# Patient Record
Sex: Male | Born: 1985 | Race: White | Hispanic: No | Marital: Single | Smoking: Former smoker
Health system: Southern US, Community
[De-identification: ages and names within clinical notes are randomized; demographics above are authoritative.]

## PROBLEM LIST (undated history)

## (undated) DIAGNOSIS — I82409 Acute embolism and thrombosis of unspecified deep veins of unspecified lower extremity: Secondary | ICD-10-CM

## (undated) DIAGNOSIS — F1010 Alcohol abuse, uncomplicated: Secondary | ICD-10-CM

## (undated) DIAGNOSIS — F419 Anxiety disorder, unspecified: Secondary | ICD-10-CM

## (undated) HISTORY — PX: MOUTH SURGERY: SHX715

## (undated) HISTORY — DX: Acute embolism and thrombosis of unspecified deep veins of unspecified lower extremity: I82.409

## (undated) HISTORY — PX: OTHER SURGICAL HISTORY: SHX169

---
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
## 2012-10-06 HISTORY — PX: FEMUR FRACTURE SURGERY: SHX633

## 2012-10-22 ENCOUNTER — Encounter: Payer: Self-pay | Admitting: Cardiology

## 2012-10-22 ENCOUNTER — Ambulatory Visit
Admission: RE | Admit: 2012-10-22 | Discharge: 2012-10-22 | Disposition: A | Discharge status: Home / Self Care | Payer: BC Managed Care – PPO | Source: Ambulatory Visit | Attending: Orthopedic Surgery | Admitting: Orthopedic Surgery

## 2012-10-22 ENCOUNTER — Other Ambulatory Visit: Payer: Self-pay | Admitting: Orthopedic Surgery

## 2012-10-22 ENCOUNTER — Ambulatory Visit (INDEPENDENT_AMBULATORY_CARE_PROVIDER_SITE_OTHER): Payer: BC Managed Care – PPO | Admitting: Cardiology

## 2012-10-22 VITALS — BP 142/84 | HR 104 | Ht 75.0 in | Wt 184.5 lb

## 2012-10-22 DIAGNOSIS — I82401 Acute embolism and thrombosis of unspecified deep veins of right lower extremity: Secondary | ICD-10-CM

## 2012-10-22 DIAGNOSIS — R0602 Shortness of breath: Secondary | ICD-10-CM

## 2012-10-22 DIAGNOSIS — I82409 Acute embolism and thrombosis of unspecified deep veins of unspecified lower extremity: Secondary | ICD-10-CM

## 2012-10-22 DIAGNOSIS — Z8781 Personal history of (healed) traumatic fracture: Secondary | ICD-10-CM

## 2012-10-22 DIAGNOSIS — R0609 Other forms of dyspnea: Secondary | ICD-10-CM

## 2012-10-22 DIAGNOSIS — R609 Edema, unspecified: Secondary | ICD-10-CM

## 2012-10-22 DIAGNOSIS — R0600 Dyspnea, unspecified: Secondary | ICD-10-CM

## 2012-10-22 DIAGNOSIS — R52 Pain, unspecified: Secondary | ICD-10-CM

## 2012-10-22 HISTORY — DX: Acute embolism and thrombosis of unspecified deep veins of unspecified lower extremity: I82.409

## 2012-10-22 MED ORDER — RIVAROXABAN 20 MG PO TABS: 20.0000 mg | ORAL_TABLET | Freq: Every day | ORAL | Status: DC

## 2012-10-22 MED ORDER — RIVAROXABAN 15 MG PO TABS: 15.0000 mg | ORAL_TABLET | Freq: Two times a day (BID) | ORAL | Status: DC

## 2012-10-22 NOTE — Patient Instructions (Addendum)
Take Xarelto with food 15 mg twice a day for 21 days then 20 mg daily which we will stop in 3 months once repeat doppler is clear.  Have lab work done tomorrow.  We have ordered an echo (ultrasound) of you heart, to make sure it is stable- tomorrow.  Call if any questions.  We will repeat doppler of leg in 3 months.  No strenuous activity, walking around the house only for next several days.   Follow up with Nada Boozer, NP in 4 weeks.

## 2012-10-22 NOTE — Assessment & Plan Note (Addendum)
DVT of rt lower ext with pain, + dopplers , add Xarelto 15 mg BID with food for 21 days then 20 mg daily with food.  Repeat venous doppler in 3 months.  Will also check Echo to ensure no R v strain.  Follow up with me in 1 month. Dr. [name] in 3 months post follow up dopplers.

## 2012-10-22 NOTE — Progress Notes (Signed)
HPI: 27 year old white male presents today for request of Dr. Eulah Berry after venous Doppler study of right leg is positive for DVT.  2 weeks ago patient was in [place] fell off of a slide onto his right hip and fractured his femur, rods were placed.  The patient had been doing well until yesterday he complained of right leg pain he was sent for DVT Dopplers which were positive.     He is here for treatment.  No chest pain no true shortness of breath other than a mild twinge of discomfort or mild shortness of breath when he is anxious.  No history of diabetes, hypertension or any medical problems. No family history of coagulation problems.   No Known Allergies  Current Outpatient Prescriptions  Medication Sig Dispense Refill  . ALPRAZolam (XANAX) 0.25 MG tablet Take 0.25 mg by mouth at bedtime as needed.      Brent Berry HYDROcodone-acetaminophen (NORCO) 10-325 MG per tablet Take 1 tablet by mouth every 6 (six) hours as needed for pain.      . Rivaroxaban (XARELTO) 15 MG TABS tablet Take 1 tablet (15 mg total) by mouth 2 (two) times daily with a meal. For 21 day total, then 20 mg daily.  42 tablet  0  . Rivaroxaban (XARELTO) 15 MG TABS tablet Take 1 tablet (15 mg total) by mouth 2 (two) times daily with a meal.  21 tablet  0  . Rivaroxaban (XARELTO) 20 MG TABS tablet Take 1 tablet (20 mg total) by mouth daily with supper. Begin when 15 mg twice daily treatment (21 days total) is complete.  30 tablet  5   No current facility-administered medications for this visit.    Past Medical History  Diagnosis Date  . DVT of lower extremity (deep venous thrombosis) 10/22/12    Rt. lower leg, mid to distal SFV, extending down through the popliteal and posterior tibial and peroneal veins    Past Surgical History  Procedure Laterality Date  . Femur fracture surgery  10/06/12    rods placed right leg- Dr. Eulah Berry  . Foot warts      Family History  Problem Relation Age of Onset  . Diabetes type I Sister   .  Heart failure Maternal Grandmother   . Hypertension Maternal Grandmother   . Healthy Mother   . Healthy Father   . Healthy Brother     History   Social History  . Marital Status: Single    Spouse Name: [name]    Number of Children: [name]  . Years of Education: [name]   Occupational History  . Not on file.   Social History Main Topics  . Smoking status: Former Games developer  . Smokeless tobacco: Never Used     Comment: quit 8-9 years ago  . Alcohol Use: 3 - 3.5 oz/week    6-7 drink(s) per week  . Drug Use: No  . Sexual Activity: Not on file   Other Topics Concern  . Not on file   Social History Narrative  . No narrative on file    QMV:HQIONGE:XB colds or fevers, no weight changes Skin:no rashes or ulcers HEENT:no blurred vision, no congestion CV:see HPI PUL:see HPI GI:no diarrhea constipation or melena, no indigestion GU:no hematuria, no dysuria MS:+ rt hip pain at surgical site. + rt calf pain and swelling  Neuro:no syncope, no lightheadedness Endo:no diabetes, no thyroid disease   PHYSICAL EXAM BP 142/84  Pulse 104  Ht 6\' 3"  (1.905 m)  Wt 184 lb 8 oz (83.689 kg)  BMI 23.06 kg/m2 General:Pleasant affect, NAD, though anxious Skin:Warm and dry, brisk capillary refill HEENT:normocephalic, sclera clear, mucus membranes moist Neck:supple, no JVD, no bruits  Heart:S1S2 RRR without murmur, gallup, rub or click Lungs:clear without rales, rhonchi, or wheezes RUE:AVWU, non tender, + BS, do not palpate liver spleen or masses Ext:+ mild edema of rt calf,no erythema, no   lower ext edema, 2+ pedal pulses, 2+ radial pulses Neuro:alert and oriented, MAE, follows commands, + facial symmetry  EKG: ST no abnormalities  ASSESSMENT AND PLAN DVT of lower limb, acute, involving mid to distal SFV extending down through the popliteal vein and posterior tibial and peroneal veins DVT of rt lower ext with pain, + dopplers , add Xarelto 15 mg BID with food for 21 days then 20 mg daily with  food.  Repeat venous doppler in 3 months.  Will also check Echo to ensure no R v strain.  Follow up with me in 1 month. Dr. Allyson Berry in 3 months post follow up dopplers.  Hx of fracture of femur, rt from fall in [place]. with surgery Healing   We'll also do labs CMP and CBC  Dr. Allyson Berry spoke to the patient and his mother as well

## 2012-10-22 NOTE — Assessment & Plan Note (Signed)
Healing

## 2012-10-23 ENCOUNTER — Other Ambulatory Visit (HOSPITAL_COMMUNITY): Payer: Self-pay | Admitting: Cardiovascular Disease

## 2012-10-23 ENCOUNTER — Ambulatory Visit (HOSPITAL_COMMUNITY)
Admission: RE | Admit: 2012-10-23 | Discharge: 2012-10-23 | Disposition: A | Discharge status: Home / Self Care | Payer: BC Managed Care – PPO | Source: Ambulatory Visit | Attending: Cardiology | Admitting: Cardiology

## 2012-10-23 DIAGNOSIS — R0600 Dyspnea, unspecified: Secondary | ICD-10-CM

## 2012-10-23 DIAGNOSIS — R0609 Other forms of dyspnea: Secondary | ICD-10-CM | POA: Insufficient documentation

## 2012-10-23 DIAGNOSIS — Z86718 Personal history of other venous thrombosis and embolism: Secondary | ICD-10-CM | POA: Insufficient documentation

## 2012-10-23 DIAGNOSIS — R0602 Shortness of breath: Secondary | ICD-10-CM

## 2012-10-23 DIAGNOSIS — I82401 Acute embolism and thrombosis of unspecified deep veins of right lower extremity: Secondary | ICD-10-CM

## 2012-10-23 DIAGNOSIS — R0989 Other specified symptoms and signs involving the circulatory and respiratory systems: Secondary | ICD-10-CM | POA: Insufficient documentation

## 2012-10-23 NOTE — Progress Notes (Signed)
2D Echo Performed 10/23/2012    Petula Rotolo, RCS  

## 2012-10-24 ENCOUNTER — Telehealth: Payer: Self-pay | Admitting: Cardiology

## 2012-10-24 LAB — COMPREHENSIVE METABOLIC PANEL
Albumin: 4.1 g/dL (ref 3.5–5.2)
Alkaline Phosphatase: 123 U/L — ABNORMAL HIGH (ref 39–117)
BUN: 9 mg/dL (ref 6–23)
Glucose, Bld: 89 mg/dL (ref 70–99)
Potassium: 3.9 mEq/L (ref 3.5–5.3)

## 2012-10-24 LAB — CBC WITH DIFFERENTIAL/PLATELET
Eosinophils Absolute: 0.6 10*3/uL (ref 0.0–0.7)
Hemoglobin: 11.7 g/dL — ABNORMAL LOW (ref 13.0–17.0)
Lymphs Abs: 1.6 10*3/uL (ref 0.7–4.0)
MCH: 29.1 pg (ref 26.0–34.0)
Monocytes Relative: 12 % (ref 3–12)
Neutro Abs: 3.3 10*3/uL (ref 1.7–7.7)
Neutrophils Relative %: 53 % (ref 43–77)
RBC: 4.02 MIL/uL — ABNORMAL LOW (ref 4.22–5.81)

## 2012-10-24 NOTE — Telephone Encounter (Signed)
Returned call.  Pt with questions about DVT.  Wanted to know if it's normal to have pain and tightness and pain w/ walking.  Informed it is normal to have pain in leg, pain w/ walking, tightness, tenderness to touch and swelling.  Pt stated he wasn't sure and wanted to know.  Also stated he sent the [place] of his leg to our office.  Wanted to make sure someone looked at it.  Pt also unsure of treatment plan and informed initially he's been started on blood thinner to prevent further clotting, f/u with NP in 1 month and f/u doppler in 3 months along w/ appt w/ Dr. [name] to recheck.  Pt verbalized understanding and would like more information.  Also stated his pain has moved from his leg to his thigh.  Pt informed NP not in office, but will be notified and RN will call him back.  Pt verbalized understanding and agreed w/ plan.  Vernona Rieger, NP paged and responded.  Informed as stated above.  Agreed w/ advice given.  Stated pt should limit activity this week and can increase activity next week.  Stated Dr. [name] will review 3 mo f/u doppler and decide if pt should continue Xarelto or not depending on if the clot has resolved.  Stated this was reviewed w/ pt, mother and brother at visit.  Also stated US and test results have been forwarded to Dr. [name] as he is his pt.  Returned call and informed pt per instructions by NP.  Pt also advised to avoid massaging affected leg.  Stretching okay.  Pt verbalized understanding and agreed w/ plan.

## 2012-10-24 NOTE — Telephone Encounter (Signed)
Please call-saw Vernona Rieger on Wednesday-have some questions.

## 2012-10-27 ENCOUNTER — Telehealth: Payer: Self-pay | Admitting: Pharmacist Clinician (PhC)/ Clinical Pharmacy Specialist

## 2012-10-27 ENCOUNTER — Telehealth: Payer: Self-pay | Admitting: *Deleted

## 2012-10-27 DIAGNOSIS — D649 Anemia, unspecified: Secondary | ICD-10-CM
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
## 2012-10-27 NOTE — Telephone Encounter (Signed)
Order placed for repeat CBC

## 2012-10-27 NOTE — Telephone Encounter (Signed)
Message copied by Marella Bile on Mon Oct 27, 2012  4:04 PM ------      Message from: Leone Brand      Created: Fri Oct 24, 2012  6:44 AM       Mild anemia, but otherwise labs ok.  Repeat CBC in 2 weeks just to make sure everything stays stable. ------

## 2012-10-27 NOTE — Telephone Encounter (Signed)
Message copied by Rosalee Kaufman on Mon Oct 27, 2012  4:30 PM ------      Message from: Leone Brand      Created: Fri Oct 24, 2012  9:31 AM       Brent Berry. 27 years old, I did labs CMP CBC. And Echo, mild anemia.  Thanks. ------

## 2012-10-28 ENCOUNTER — Telehealth: Payer: Self-pay | Admitting: Cardiovascular Disease
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
## 2012-10-28 NOTE — Telephone Encounter (Signed)
Had a blood clot a few days ago-please call he needs to ask some questions.

## 2012-11-07 ENCOUNTER — Telehealth: Payer: Self-pay | Admitting: Cardiovascular Disease

## 2012-11-07 NOTE — Telephone Encounter (Signed)
Patient would like to talk about his condition.  Wants to know when he is to change the dosage of his Xarelto.  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

## 2012-11-07 NOTE — Telephone Encounter (Signed)
Returned call.  Pt with questions about activity and when he is supposed to change his Xarelto dose.  Pt informed per note, he is supposed to take Xarelto 15 mg twice daily for 21 days and then 20 mg daily until 3 month f/u appt.  Pt verbalized understanding.  Pt offered appt to discuss activity and other concerns r/t DVT and pt agreed.  Appt scheduled for Monday, 9.22.14 at 3 pm w/ Vernona Rieger, NP.  Pt agreed w/ plan.

## 2012-11-10 ENCOUNTER — Ambulatory Visit (INDEPENDENT_AMBULATORY_CARE_PROVIDER_SITE_OTHER): Payer: BC Managed Care – PPO | Admitting: Cardiology

## 2012-11-10 ENCOUNTER — Encounter: Payer: Self-pay | Admitting: Cardiology

## 2012-11-10 VITALS — BP 124/80 | HR 88 | Ht 75.0 in | Wt 182.3 lb

## 2012-11-10 DIAGNOSIS — I82401 Acute embolism and thrombosis of unspecified deep veins of right lower extremity: Secondary | ICD-10-CM

## 2012-11-10 DIAGNOSIS — K922 Gastrointestinal hemorrhage, unspecified: Secondary | ICD-10-CM

## 2012-11-10 DIAGNOSIS — Z8781 Personal history of (healed) traumatic fracture: Secondary | ICD-10-CM

## 2012-11-10 DIAGNOSIS — I82409 Acute embolism and thrombosis of unspecified deep veins of unspecified lower extremity: Secondary | ICD-10-CM

## 2012-11-10 DIAGNOSIS — D649 Anemia, unspecified: Secondary | ICD-10-CM

## 2012-11-10 DIAGNOSIS — Z7901 Long term (current) use of anticoagulants: Secondary | ICD-10-CM | POA: Insufficient documentation

## 2012-11-10 LAB — CBC
MCH: 29.3 pg (ref 26.0–34.0)
MCV: 88.7 fL (ref 78.0–100.0)
Platelets: 309 10*3/uL (ref 150–400)
RDW: 13.9 % (ref 11.5–15.5)

## 2012-11-10 MED ORDER — LORAZEPAM 0.5 MG PO TABS: 0.5000 mg | ORAL_TABLET | Freq: Three times a day (TID) | ORAL | Status: DC | PRN

## 2012-11-10 NOTE — Assessment & Plan Note (Signed)
H/h mildly low on last check will repeat today.

## 2012-11-10 NOTE — Progress Notes (Signed)
11/10/2012   PCP: No PCP Per Patient   Chief Complaint  Patient presents with  . Follow-up    requested early follow-up for clot.  Has questions after reading up on his condition.    Primary Cardiologist:Dr. [name]  HPI: 27 year old white male presented for follow up from recent hx of right leg is positive for DVT. 2 weeks ago patient was in [place] fell off of a slide onto his right hip and fractured his femur, rods were placed. The patient had been doing well until 10/21/12 he complained of right leg pain he was sent for DVT Dopplers which were positive. Pt was then seen by myself and Dr. [name].  Xarelto was added 15 mg BID for 21 days then 20 mg daily.   No chest pain no true shortness of breath other than a mild twinge of discomfort or mild shortness of breath when he is anxious. No history of diabetes, hypertension or any medical problems. No family history of coagulation problems. Still with some Rt. Leg pain, but most swelling has resolved and pain is controlled.  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    He does state he has had bright blood in his stools but with stool softener the blood has resolved.  Pt's echo was normal.  His H/H slightly low so we will recheck today.   No Known Allergies  Current Outpatient Prescriptions  Medication Sig Dispense Refill  . ALPRAZolam (XANAX) 0.25 MG tablet Take 0.25 mg by mouth at bedtime as needed.      Marland Kitchen HYDROcodone-acetaminophen (NORCO) 10-325 MG per tablet Take 1 tablet by mouth every 6 (six) hours as needed for pain.      . Rivaroxaban (XARELTO) 15 MG TABS tablet Take 1 tablet (15 mg total) by mouth 2 (two) times daily with a meal. For 21 day total, then 20 mg daily.  42 tablet  0  . Rivaroxaban (XARELTO) 20 MG TABS tablet Take 1 tablet (20 mg total) by mouth daily with supper. Begin when 15 mg twice daily treatment (21 days total) is complete.  30 tablet  5   No current facility-administered medications for  this visit.    Past Medical History  Diagnosis Date  . DVT of lower extremity (deep venous thrombosis) 10/22/12    Rt. lower leg, mid to distal SFV, extending down through the popliteal and posterior tibial and peroneal veins    Past Surgical History  Procedure Laterality Date  . Femur fracture surgery  10/06/12    rods placed right leg- Dr. [name]  . Foot warts      WUJ:WJXBJYN:WG colds or fevers, no weight changes Skin:no rashes or ulcers HEENT:no blurred vision, no congestion CV:see HPI PUL:see HPI GI:no diarrhea ,constipation + melena, no indigestion GU:no hematuria, no dysuria MS:no joint pain, no claudication Neuro:no syncope, no lightheadedness Endo:no diabetes, no thyroid disease  PHYSICAL EXAM BP 124/80  Pulse 88  Ht 6\' 3"  (1.905 m)  Wt 182 lb 4.8 oz (82.691 kg)  BMI 22.79 kg/m2 General:Pleasant affect, NAD Skin:Warm and dry, brisk capillary refill HEENT:normocephalic, sclera clear, mucus membranes moist Neck:supple, no JVD, no bruits  Heart:S1S2 RRR without murmur, gallup, rub or click Lungs:clear without rales, rhonchi, or wheezes NFA:OZHY, non tender, + BS, do not palpate liver spleen or masses Ext:no lower ext edema, 2+ pedal pulses, 2+ radial pulses Neuro:alert and oriented, MAE, follows commands, + facial symmetry   ASSESSMENT AND PLAN DVT  of lower limb, acute, involving mid to distal SFV extending down through the popliteal vein and posterior tibial and peroneal veins On Xarelto, to change to 20 mg daily on the 24th.  Have cleared for trips but to get out and walk every 1.5 to 2 hours.   Will have follow up dopplers in 2 months and then follow up with Dr. [name].     Hx of fracture of femur, rt from fall in [place]. with surgery Continues to improve  GI bleed, bright blood with stool, improved with stool softner H/h mildly low on last check will repeat today.

## 2012-11-10 NOTE — Assessment & Plan Note (Signed)
On Xarelto, to change to 20 mg daily on the 24th.  Have cleared for trips but to get out and walk every 1.5 to 2 hours.   Will have follow up dopplers in 2 months and then follow up with Dr. [name].

## 2012-11-10 NOTE — Patient Instructions (Signed)
Please schedule rt lower ext venous doppler for 2 months.  Then follow up with Dr. [name].  When traveling walk around the car every 1.5 to 2 hours.  Call if any problems or questions.  Change Xarelto to 20 mg daily on 11/12/12  Have blood work done today

## 2012-11-10 NOTE — Assessment & Plan Note (Signed)
Continues to improve 

## 2012-11-11 ENCOUNTER — Encounter: Payer: Self-pay | Admitting: Cardiovascular Disease

## 2012-11-12 ENCOUNTER — Encounter: Payer: Self-pay | Admitting: *Deleted

## 2012-11-14 NOTE — Telephone Encounter (Signed)
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

## 2012-11-18 ENCOUNTER — Ambulatory Visit: Payer: BC Managed Care – PPO | Admitting: Cardiology

## 2012-11-20 ENCOUNTER — Telehealth: Payer: Self-pay | Admitting: Cardiovascular Disease

## 2012-11-20 NOTE — Telephone Encounter (Signed)
Dr. Salena Saner. Can you speak with this patient please. Judie Grieve has already gone for the day.

## 2012-11-20 NOTE — Telephone Encounter (Signed)
JB, please address

## 2012-11-20 NOTE — Telephone Encounter (Signed)
Still having pain in leg with bloodclot and tender in area and feeling a knot  Wants to discuss if normal

## 2012-11-20 NOTE — Telephone Encounter (Signed)
Spoke with patient concerning his blood clot. He states that the pain in his leg is somewhat different and he is feeling a "knot" wants to know what exactly he should be feeling. He was never told where the clot was exactly.he was just told that it was " big" I will consult with one of the MD'S for advice.

## 2012-11-23 ENCOUNTER — Emergency Department (HOSPITAL_COMMUNITY)
Admission: EM | Admit: 2012-11-23 | Discharge: 2012-11-23 | Disposition: A | Discharge status: Home / Self Care | Payer: BC Managed Care – PPO | Attending: Emergency Medicine | Admitting: Emergency Medicine

## 2012-11-23 ENCOUNTER — Encounter (HOSPITAL_COMMUNITY): Payer: Self-pay | Admitting: Emergency Medicine

## 2012-11-23 DIAGNOSIS — Z79899 Other long term (current) drug therapy: Secondary | ICD-10-CM | POA: Insufficient documentation

## 2012-11-23 DIAGNOSIS — R599 Enlarged lymph nodes, unspecified: Secondary | ICD-10-CM | POA: Insufficient documentation

## 2012-11-23 DIAGNOSIS — M79609 Pain in unspecified limb: Secondary | ICD-10-CM

## 2012-11-23 DIAGNOSIS — R590 Localized enlarged lymph nodes: Secondary | ICD-10-CM

## 2012-11-23 DIAGNOSIS — I82409 Acute embolism and thrombosis of unspecified deep veins of unspecified lower extremity: Secondary | ICD-10-CM | POA: Insufficient documentation

## 2012-11-23 DIAGNOSIS — Z87891 Personal history of nicotine dependence: Secondary | ICD-10-CM | POA: Insufficient documentation

## 2012-11-23 DIAGNOSIS — I82401 Acute embolism and thrombosis of unspecified deep veins of right lower extremity: Secondary | ICD-10-CM

## 2012-11-23 DIAGNOSIS — IMO0002 Reserved for concepts with insufficient information to code with codable children: Secondary | ICD-10-CM | POA: Insufficient documentation

## 2012-11-23 LAB — URINALYSIS W MICROSCOPIC + REFLEX CULTURE
Glucose, UA: NEGATIVE mg/dL
Hgb urine dipstick: NEGATIVE
Nitrite: NEGATIVE
Protein, ur: NEGATIVE mg/dL
Urine-Other: NONE SEEN
pH: 8 (ref 5.0–8.0)

## 2012-11-23 MED ORDER — HYDROCODONE-ACETAMINOPHEN 10-325 MG PO TABS: 1.0000 | ORAL_TABLET | Freq: Four times a day (QID) | ORAL | Status: DC | PRN

## 2012-11-23 MED ORDER — IBUPROFEN 800 MG PO TABS: 800.0000 mg | ORAL_TABLET | Freq: Three times a day (TID) | ORAL | Status: DC

## 2012-11-23 NOTE — Progress Notes (Signed)
VASCULAR LAB PRELIMINARY  PRELIMINARY  PRELIMINARY  PRELIMINARY  Right lower extremity venous duplex completed.    Preliminary report:  Right - Positive for a subacute DVT coursing from the posterior tibial and peroneal through the popliteal and mid to distal femoral. Patient was first diagnosed 10/22/2012 at [facility] imaging showing no significant flow to occlusion. There is early recanalization noted in the veins of the lower leg and popliteal. There is still no evidence of flow in the mid to distal femoral. The area of concern "knot" is quite suggestive of an enlarged lymph node. 2.63 cm x 1.44cm and a second 1.72 x 1.57 cm  Yaroslav Gombos, RVS 11/23/2012, 12:57 PM
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
## 2012-11-23 NOTE — ED Provider Notes (Signed)
Medical screening examination/treatment/procedure(s) were performed by non-physician practitioner and as supervising physician I was immediately available for consultation/collaboration.   Celene Kras, MD 11/23/12 1340
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
## 2012-11-23 NOTE — ED Notes (Signed)
Pt denies pain at this time, states he took his vicodin prior to coming into ED

## 2012-11-23 NOTE — ED Notes (Signed)
List of resources provided for patient

## 2012-11-24 ENCOUNTER — Telehealth: Payer: Self-pay | Admitting: Cardiovascular Disease

## 2012-11-24 LAB — GC/CHLAMYDIA PROBE AMP
CT Probe RNA: NEGATIVE
GC Probe RNA: NEGATIVE

## 2012-11-24 NOTE — Telephone Encounter (Signed)
No answer/mailbox is full. Will defer to Dr. [name] and Samara Deist.

## 2012-11-24 NOTE — Telephone Encounter (Signed)
Pt called on Thursday about a knot in his leg that he was concerned about being related to his DVT.  He has not received a call back.  Went to ER yesterday and was told it was a lymph node in his leg.He still wants to talk to someone about this issue.  Would like for Dr. [name] to call him.

## 2012-11-24 NOTE — Telephone Encounter (Signed)
No answer - mailbox is full.  Deferred to Dr. [name] and Samara Deist.

## 2012-11-25 ENCOUNTER — Ambulatory Visit: Payer: BC Managed Care – PPO | Admitting: Cardiology

## 2012-12-01 NOTE — Telephone Encounter (Signed)
Have patient come back to see a mid-level provider tomorrow

## 2012-12-02 NOTE — Telephone Encounter (Signed)
lmom 

## 2012-12-02 NOTE — Telephone Encounter (Signed)
Returning your call. °

## 2012-12-09 NOTE — Telephone Encounter (Signed)
lmom to call back if he still needs our assistance 

## 2012-12-09 NOTE — Telephone Encounter (Signed)
lmom to call back if he still needs our assistance

## 2012-12-18 ENCOUNTER — Telehealth: Payer: Self-pay | Admitting: Cardiovascular Disease

## 2012-12-18 NOTE — Telephone Encounter (Signed)
Has a swollen lymph node in is groin area and wants to know if it is related to the blood clot. Please call  Thanks

## 2012-12-18 NOTE — Telephone Encounter (Signed)
Returned call and pt verified x 2.  Pt c/o swollen lymph node in groin area.  Stated he was seen in ER for it and they gave him some anti-inflammatory medications for a few weeks.  Pt stated he thinks it came from a cat scratch he got a little before his last visit here.  Pt informed last OV here was 9.22.14.  Pt stated that's about right.  Stated swelling is better, but it's still there.  RN explained that swollen lymph nodes indicate infection in the body, usually below the area of swollen node.  Pt advised to go to Urgent Care for swollen lymph node since he does not have a PCP.  Pt verbalized understanding and agreed w/ plan.  Pt also asked if it is okay to take the abx eye drops he has for something in his eye.  Does not know name and wants to know if it will interact w/ blood thinner.  Pt advised to see Urgent Care for eye problem when he goes for lymph node and let provider advise of tx.  Pt also advised to avoid taking abx w/o current order as he can become resistant to abx.  Pt verbalized understanding and agreed w/ plan.

## 2013-01-08 ENCOUNTER — Telehealth: Payer: Self-pay | Admitting: Cardiology

## 2013-01-08 NOTE — Telephone Encounter (Signed)
Please call-question about his medicine. °

## 2013-01-08 NOTE — Telephone Encounter (Signed)
Returned call.  Pt stated he missed his Xarleto last night and took it this morning.  Pt advised to take PM dose as scheduled to get back on track.  Pt verbalized understanding and agreed w/ plan.

## 2013-01-13 ENCOUNTER — Ambulatory Visit (INDEPENDENT_AMBULATORY_CARE_PROVIDER_SITE_OTHER): Payer: BC Managed Care – PPO | Admitting: Cardiovascular Disease

## 2013-01-13 ENCOUNTER — Encounter: Payer: Self-pay | Admitting: Cardiovascular Disease

## 2013-01-13 VITALS — BP 121/73 | HR 73 | Ht 74.0 in | Wt 187.0 lb

## 2013-01-13 DIAGNOSIS — I82409 Acute embolism and thrombosis of unspecified deep veins of unspecified lower extremity: Secondary | ICD-10-CM

## 2013-01-13 DIAGNOSIS — I82401 Acute embolism and thrombosis of unspecified deep veins of right lower extremity: Secondary | ICD-10-CM

## 2013-01-13 NOTE — Progress Notes (Signed)
01/13/2013 KAZUKI INGLE   09/22/1985  [id number]  Primary Physician No PCP Per Patient Primary Cardiologist: Runell Gess MD Roseanne Reno  HPI:  27 year old white male presented for follow up from recent hx of right leg is positive for DVT. In September, while in [place], he fell off of a slide onto his right hip and fractured his femur, rods were placed. The patient had been doing well until 10/21/12 he complained of right leg pain he was sent for DVT Dopplers which were positive. Pt was then seen by myself and Dr. [name]. Xarelto was added 15 mg BID for 21 days then 20 mg daily.  No chest pain no true shortness of breath other than a mild twinge of discomfort or mild shortness of breath when he is anxious. No history of diabetes, hypertension or any medical problems. No family history of coagulation problems. Still with some Rt. Leg pain, but most swelling has resolved and pain is controlled. 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  He does state he has had bright blood in his stools but with stool softener the blood has resolved. Pt's echo was normal. His H/H slightly low so we will recheck today. He had a followup venous Doppler study performed last month that showed some resolution of his DVT done because of evaluation of lymphadenopathy in his right groin which he attributes to being scratched by his cat. He is currently on crutches and emulating without difficulty. He is asymptomatic.    Current Outpatient Prescriptions  Medication Sig Dispense Refill  . ALPRAZolam (XANAX) 1 MG tablet Take 0.5-1 mg by mouth at bedtime as needed for sleep or anxiety.      Marland Kitchen ibuprofen (ADVIL,MOTRIN) 800 MG tablet Take 800 mg by mouth as needed.      . mometasone (NASONEX) 50 MCG/ACT nasal spray Place 2 sprays into the nose daily as needed (allergies).      . Rivaroxaban (XARELTO) 20 MG TABS tablet Take 1 tablet (20 mg total) by mouth daily with supper. Begin when 15  mg twice daily treatment (21 days total) is complete.  30 tablet  5   No current facility-administered medications for this visit.    No Known Allergies  History   Social History  . Marital Status: Single    Spouse Name: [name]    Number of Children: [name]  . Years of Education: [name]   Occupational History  . Not on file.   Social History Main Topics  . Smoking status: Former Games developer  . Smokeless tobacco: Never Used     Comment: quit 8-9 years ago  . Alcohol Use: 3 - 3.5 oz/week    6-7 drink(s) per week  . Drug Use: No  . Sexual Activity: Not on file   Other Topics Concern  . Not on file   Social History Narrative  . No narrative on file     Review of Systems: General: negative for chills, fever, night sweats or weight changes.  Cardiovascular: negative for chest pain, dyspnea on exertion, edema, orthopnea, palpitations, paroxysmal nocturnal dyspnea or shortness of breath Dermatological: negative for rash Respiratory: negative for cough or wheezing Urologic: negative for hematuria Abdominal: negative for nausea, vomiting, diarrhea, bright red blood per rectum, melena, or hematemesis Neurologic: negative for visual changes, syncope, or dizziness All other systems reviewed and are otherwise negative except as noted above.    Blood pressure 121/73, pulse 73, height 6\' 2"  (1.88 m),  weight 187 lb (84.823 kg).  General appearance: alert and no distress Neck: no adenopathy, no carotid bruit, no JVD, supple, symmetrical, trachea midline and thyroid not enlarged, symmetric, no tenderness/mass/nodules Lungs: clear to auscultation bilaterally Heart: regular rate and rhythm, S1, S2 normal, no murmur, click, rub or gallop Extremities: extremities normal, atraumatic, no cyanosis or edema  EKG not performed today  ASSESSMENT AND PLAN:   DVT of lower limb, acute, involving mid to distal SFV extending down through the popliteal vein and posterior tibial and peroneal veins Patient  had DVT of his right lower shimmy after trauma and surgery. He has been on Xarelto oral anticoagulation. He has been otherwise asymptomatic and emulating now without crutches. He did have venous Doppler study done last month showed some resolution compared to the original Doppler done the prior month. We will recheck a venous Doppler study in January ,4  months on oral anticoagulation and if his DVT has not progressed., he will discontinue  Oral anticoagulation and see him back when necessary      Runell Gess MD [facility], [facility] 01/13/2013 10:09 AM

## 2013-01-13 NOTE — Assessment & Plan Note (Signed)
Patient had DVT of his right lower shimmy after trauma and surgery. He has been on Xarelto oral anticoagulation. He has been otherwise asymptomatic and emulating now without crutches. He did have venous Doppler study done last month showed some resolution compared to the original Doppler done the prior month. We will recheck a venous Doppler study in January ,4  months on oral anticoagulation and if his DVT has not progressed., he will discontinue  Oral anticoagulation and see him back when necessary

## 2013-01-13 NOTE — Patient Instructions (Signed)
  We will see you back in follow up in 3 months with Nada Boozer NP  Dr [name] has ordered lower extremity venous doppler to be done in January 2015

## 2013-03-17 ENCOUNTER — Ambulatory Visit (HOSPITAL_COMMUNITY)
Admission: RE | Admit: 2013-03-17 | Discharge: 2013-03-17 | Disposition: A | Discharge status: Home / Self Care | Payer: BC Managed Care – PPO | Source: Ambulatory Visit | Attending: Cardiovascular Disease | Admitting: Cardiovascular Disease

## 2013-03-17 DIAGNOSIS — I82509 Chronic embolism and thrombosis of unspecified deep veins of unspecified lower extremity: Secondary | ICD-10-CM

## 2013-03-17 DIAGNOSIS — I803 Phlebitis and thrombophlebitis of lower extremities, unspecified: Secondary | ICD-10-CM

## 2013-03-17 DIAGNOSIS — I82409 Acute embolism and thrombosis of unspecified deep veins of unspecified lower extremity: Secondary | ICD-10-CM

## 2013-03-17 DIAGNOSIS — I825Y9 Chronic embolism and thrombosis of unspecified deep veins of unspecified proximal lower extremity: Secondary | ICD-10-CM | POA: Insufficient documentation

## 2013-03-17 DIAGNOSIS — I82401 Acute embolism and thrombosis of unspecified deep veins of right lower extremity: Secondary | ICD-10-CM

## 2013-03-17 NOTE — Progress Notes (Addendum)
Right Lower Ext. Venous Duplex Completed. Preliminary results by tech - A small amount of residual thrombus is still present in the femoral and popliteal veins with recanalization.  Marilynne Halstedita Karsten Vaughn, BS, RDMS, RVT

## 2013-03-30 ENCOUNTER — Telehealth: Payer: Self-pay | Admitting: *Deleted

## 2013-03-30 NOTE — Telephone Encounter (Signed)
Returned call and pt verified x 2.  Pt informed message received and per result note, doppler improved.  No further remarks recorded.  Pt wanted to know if he needed to be doing anything else.  Informed message will be forwarded to Samara DeistKathryn, Dr. Hazle CocaBerry's nurse to contact him w/ that information as this RN does not have further information available.  Pt verbalized understanding and agreed w/ plan.  Message forwarded to Samara DeistKathryn, [place].

## 2013-03-30 NOTE — Telephone Encounter (Signed)
Pt was called to make an appointment with Vernona RiegerLaura and he stated that he still has not heard back from his test that he had.  JB

## 2013-03-31 NOTE — Telephone Encounter (Signed)
I spoke with patient and gave doppler results.  I advised to continue the blood thinner and keep appt. The dopplers were improved!

## 2013-04-08 ENCOUNTER — Ambulatory Visit (INDEPENDENT_AMBULATORY_CARE_PROVIDER_SITE_OTHER): Payer: BC Managed Care – PPO | Admitting: Cardiology

## 2013-04-08 ENCOUNTER — Encounter: Payer: Self-pay | Admitting: Cardiology

## 2013-04-08 VITALS — BP 118/70 | HR 74 | Ht 74.0 in | Wt 190.3 lb

## 2013-04-08 DIAGNOSIS — Z8781 Personal history of (healed) traumatic fracture: Secondary | ICD-10-CM

## 2013-04-08 DIAGNOSIS — I82409 Acute embolism and thrombosis of unspecified deep veins of unspecified lower extremity: Secondary | ICD-10-CM

## 2013-04-08 NOTE — Patient Instructions (Signed)
May stop xarelto.  Most of clot has resolved and minimal amount is stable.  We will recheck leg in 4-6 weeks to make sure off anticoagulation there is no problem.  We will call those results.   Call if need to be seen for cardiac or peripheral problems.

## 2013-04-08 NOTE — Progress Notes (Signed)
04/08/2013   PCP: Brent Bussing, MD   Chief Complaint  Patient presents with  . Follow-up    3 month; reports no worsening SOB; reports some lightheadedness/dizziness (nothing more than usual); reports "not much" bilat LE edema; reports some sinus congestion & blowing blood out of nose this AM    Primary Cardiologist:Dr. Allyson Berry  HPI:  28 year old white male presented for follow up from recent hx of right leg is positive for DVT. In September, while in [place], he fell off of a slide onto his right hip and fractured his femur, rods were placed. The patient had been doing well until 10/21/12 he complained of right leg pain he was sent for DVT Dopplers which were positive. Pt was then seen by myself and Dr. Allyson Berry. Xarelto was added 15 mg BID for 21 days then 20 mg daily.   No chest pain no true shortness of breath. No history of diabetes, hypertension or any medical problems. No family history of coagulation problems. Still with minimal discomfort inner rt groin. 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 He had venous dopplers done at 4 months and most of DVT resolved he has been on Xarelto for 4.3 months.  Discussed with Dr. Allyson Berry and we will stop Xarelto.    No Known Allergies  Current Outpatient Prescriptions  Medication Sig Dispense Refill  . ALPRAZolam (XANAX) 1 MG tablet Take 0.5-1 mg by mouth at bedtime as needed for sleep or anxiety.      Brent Berry ibuprofen (ADVIL,MOTRIN) 800 MG tablet Take 800 mg by mouth as needed.      . mometasone (NASONEX) 50 MCG/ACT nasal spray Place 2 sprays into the nose daily as needed (allergies).      . Rivaroxaban (XARELTO) 20 MG TABS tablet Take 1 tablet (20 mg total) by mouth daily with supper. Begin when 15 mg twice daily treatment (21 days total) is complete.  30 tablet  5   No current facility-administered medications for this visit.    Past Medical History  Diagnosis Date  . DVT of lower extremity  (deep venous thrombosis) 10/22/12    Rt. lower leg, mid to distal SFV, extending down through the popliteal and posterior tibial and peroneal veins    Past Surgical History  Procedure Laterality Date  . Femur fracture surgery  10/06/12    rods placed right leg- Dr. [name]  . Foot warts      ZOX:WRUEAVW:UJ colds or fevers, no weight changes CV:see HPI PUL:see HPI MS:no joint pain, no claudication    PHYSICAL EXAM BP 118/70  Pulse 74  Ht 6\' 2"  (1.88 m)  Wt 190 lb 4.8 oz (86.32 kg)  BMI 24.42 kg/m2 General:Pleasant affect, NAD Skin:Warm and dry, brisk capillary refill HEENT:normocephalic, sclera clear, mucus membranes moist Heart:S1S2 RRR without murmur, gallup, rub or click Lungs:clear without rales, rhonchi, or wheezes Ext:no lower ext edema, 2+ pedal pulses, 2+ radial pulses Neuro:alert and oriented, MAE, follows commands, + facial symmetry  EKG: SR no changes.  ASSESSMENT AND PLAN DVT of lower limb, acute, involving mid to distal SFV extending down through the popliteal vein and posterior tibial and peroneal veins Resolved, though minimal remains, has been treated for 5 months.  Stop Xarelto.  Will check rt venous doppler in 4-6 weeks to insure no recurrence.  Follow up if needed, otherwise we will call results of doppler.  Hx of fracture of femur, [place] healed  Discussed with Dr. Allyson SabalBerry

## 2013-04-08 NOTE — Assessment & Plan Note (Signed)
healed 

## 2013-04-08 NOTE — Assessment & Plan Note (Signed)
Resolved, though minimal remains, has been treated for 5 months.  Stop Xarelto.  Will check rt venous doppler in 4-6 weeks to insure no recurrence.  Follow up if needed, otherwise we will call results of doppler.

## 2013-04-24 ENCOUNTER — Other Ambulatory Visit: Payer: Self-pay | Admitting: Cardiology

## 2013-04-24 NOTE — Telephone Encounter (Signed)
Rx was sent to pharmacy electronically. 

## 2013-05-18 ENCOUNTER — Ambulatory Visit (HOSPITAL_COMMUNITY)
Admission: RE | Admit: 2013-05-18 | Discharge: 2013-05-18 | Disposition: A | Discharge status: Home / Self Care | Payer: BC Managed Care – PPO | Source: Ambulatory Visit | Attending: Cardiovascular Disease | Admitting: Cardiovascular Disease

## 2013-05-18 DIAGNOSIS — I824Y9 Acute embolism and thrombosis of unspecified deep veins of unspecified proximal lower extremity: Secondary | ICD-10-CM | POA: Insufficient documentation

## 2013-05-18 DIAGNOSIS — I82409 Acute embolism and thrombosis of unspecified deep veins of unspecified lower extremity: Secondary | ICD-10-CM

## 2013-05-18 DIAGNOSIS — I803 Phlebitis and thrombophlebitis of lower extremities, unspecified: Secondary | ICD-10-CM

## 2013-05-18 NOTE — Progress Notes (Signed)
Right Lower Ext. Venous Duplex Completed. Preliminary by tech - a small amount of non obstructing residual thrombus is still present in the femoral vein with resolution of popliteal clot.  Brent Berry, BS, RDMS, RVT

## 2014-05-15 IMAGING — US US EXTREM LOW VENOUS*R*
1 series · 13 of 24 positions shown · non-contrast
Comparison: No comparison studies available.

CLINICAL DATA: Right lower extremity pain and swelling.



[Series 1: us extrem low venous*right* · 13 of 43 slices shown]
[im 1/43]
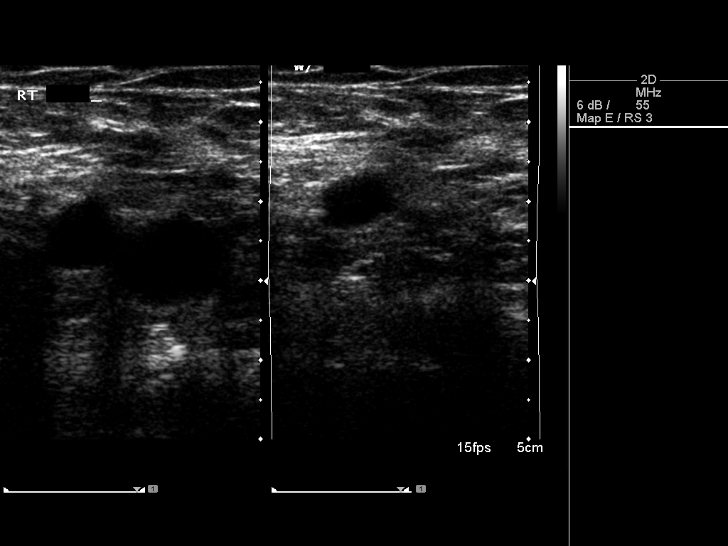
[im 4/43]
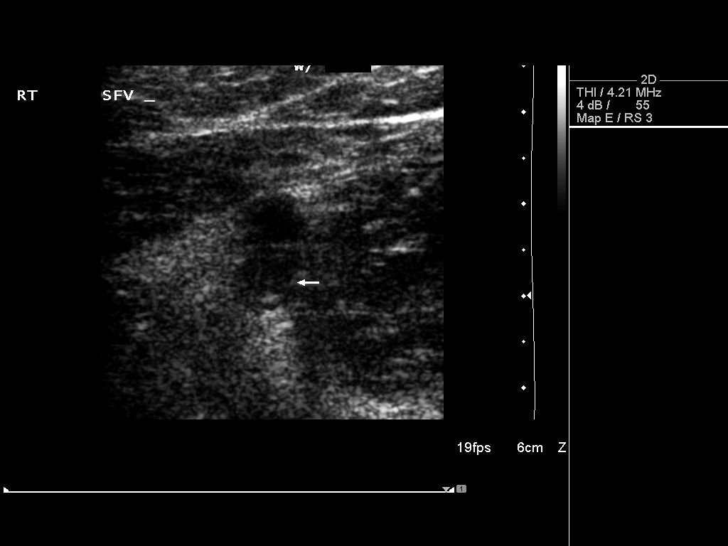
[im 8/43]
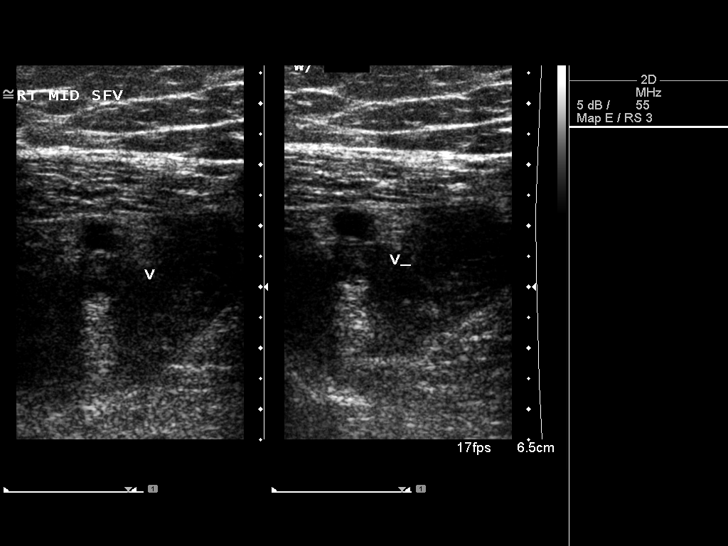
[im 11/43]
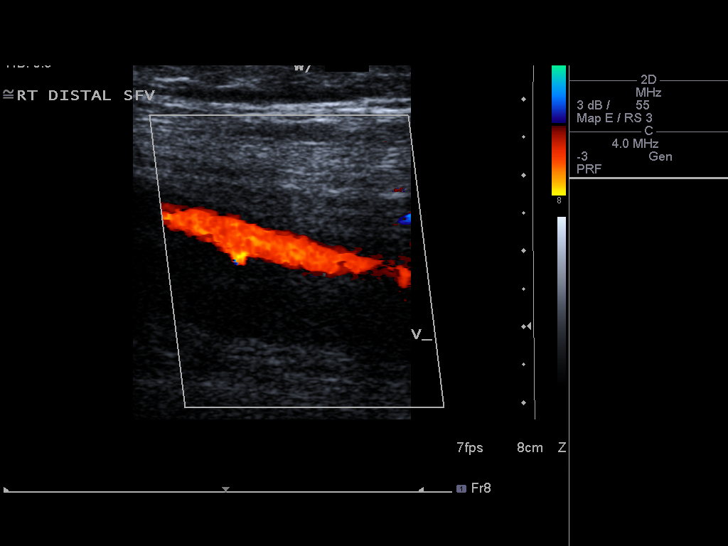
[im 15/43]
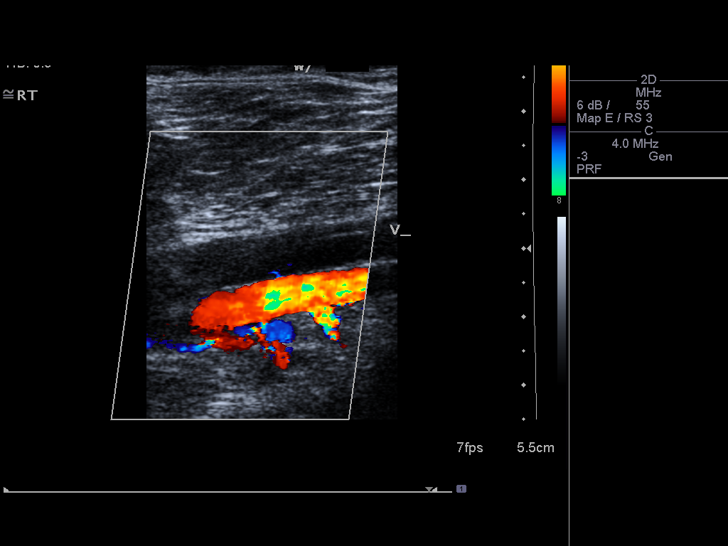
[im 19/43]
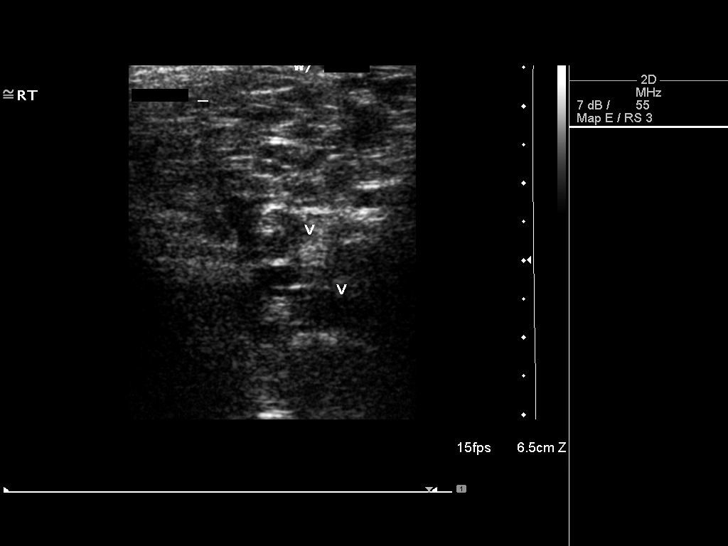
[im 22/43]
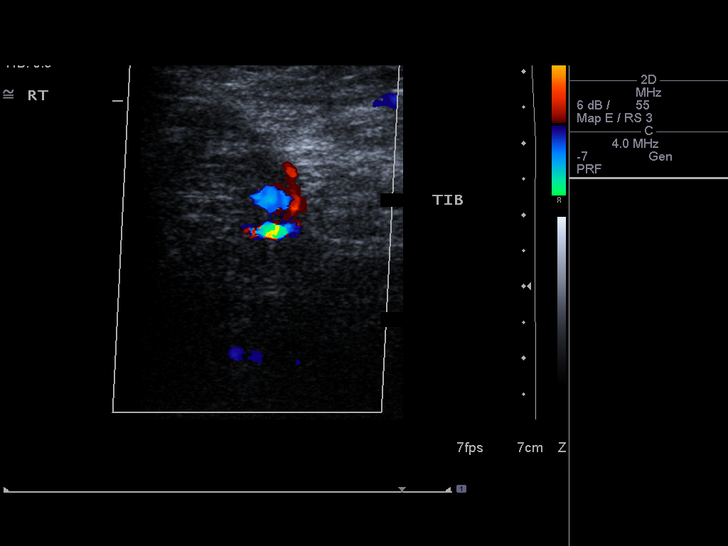
[im 24/43]
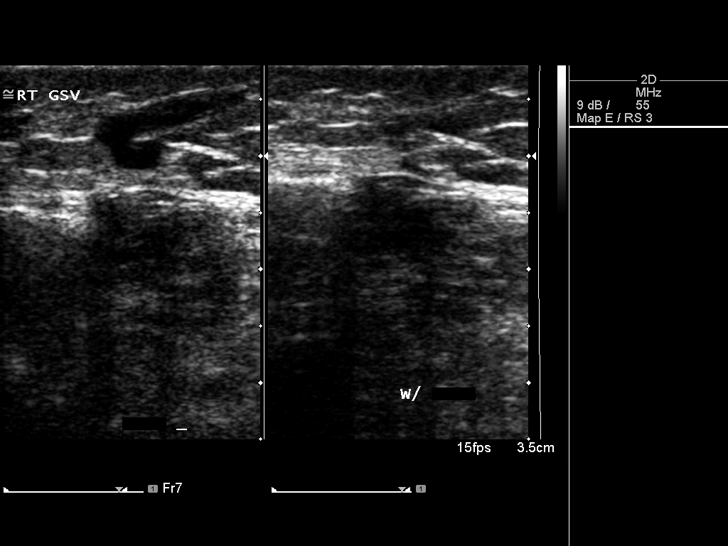
[im 28/43]
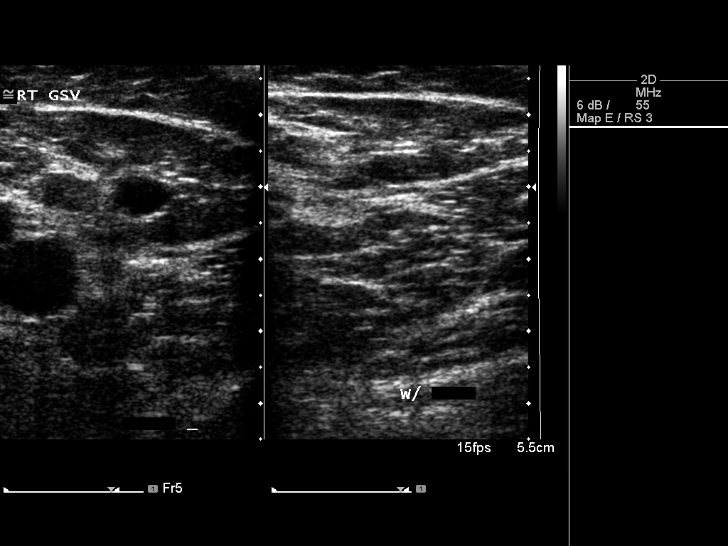
[im 32/43]
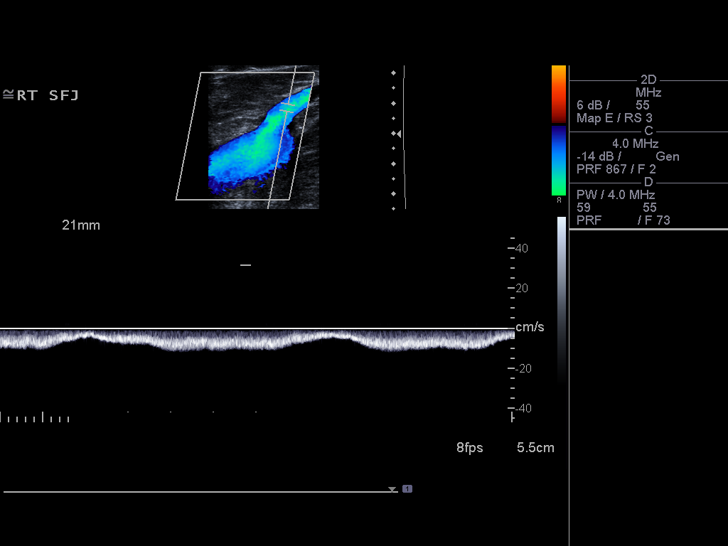
[im 35/43]
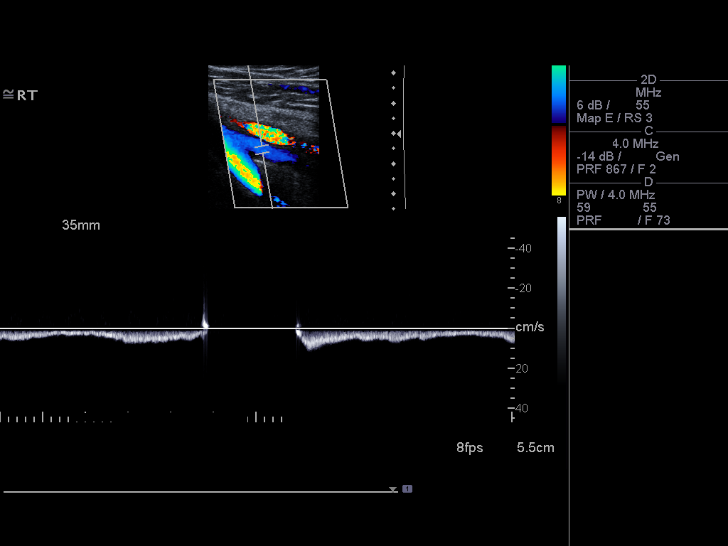
[im 39/43]
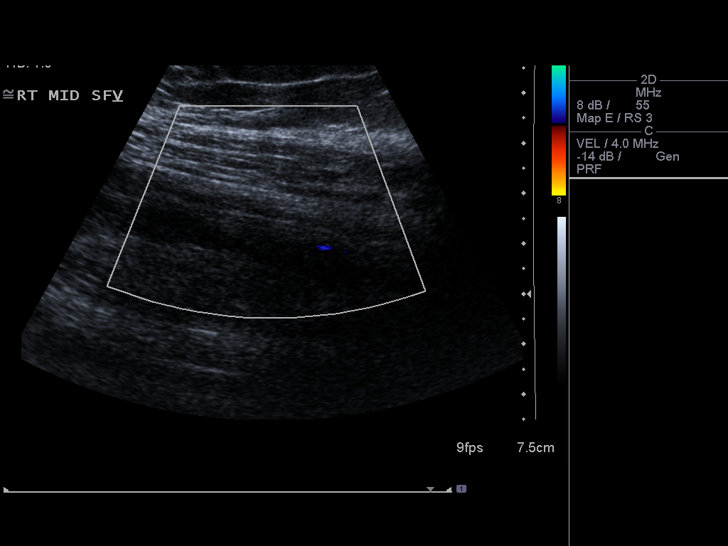
[im 43/43]
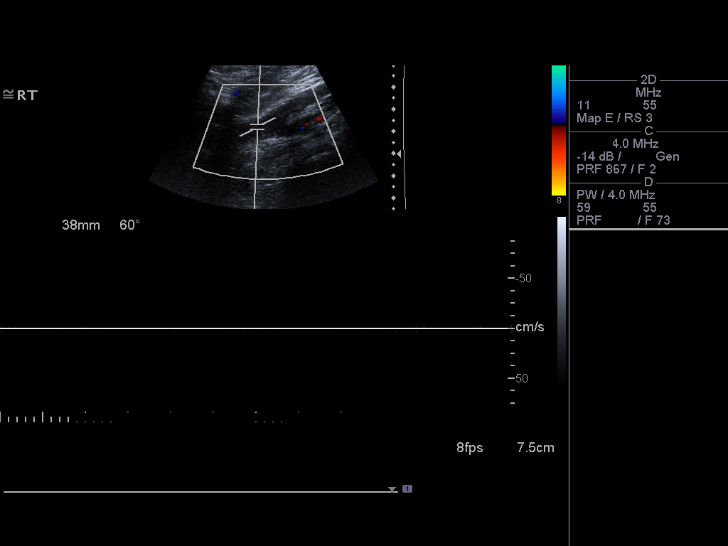

[13 of 24 positions shown; findings below may reference images not displayed]

FINDINGS: The right common femoral vein, saphenofemoral junction,
and deep femoral vein show normal patent directional flow, normal
phasicity, and normal augmentation with Valsalva maneuver.

The thrombus becomes visible in the distal aspect of the
superficial femoral vein and extends down into the popliteal vein.
The mid and distal superficial femoral vein shows lack of flow
signal on color Doppler imaging in these vessels are
noncompressible.  Thrombus is visible and posterior tibial and
peroneal veins where visualized below the knee.
IMPRESSION: Study is positive for deep vein thrombosis involving the mid to
distal SVC and extending down through the popliteal vein and the
posterior tibial and peroneal veins.

Critical Value/emergent results were called by telephone at the
time of interpretation on 10/22/2012 at 16 40 to Dr. Keyser, who
verbally acknowledged these results.

## 2017-08-13 ENCOUNTER — Other Ambulatory Visit: Payer: Self-pay

## 2017-08-13 ENCOUNTER — Encounter (HOSPITAL_COMMUNITY): Payer: Self-pay

## 2017-08-13 ENCOUNTER — Emergency Department (HOSPITAL_COMMUNITY)
Admission: EM | Admit: 2017-08-13 | Discharge: 2017-08-14 | Disposition: A | Discharge status: Home / Self Care | Payer: Self-pay | Attending: Emergency Medicine | Admitting: Emergency Medicine

## 2017-08-13 DIAGNOSIS — Z87891 Personal history of nicotine dependence: Secondary | ICD-10-CM | POA: Insufficient documentation

## 2017-08-13 DIAGNOSIS — F1010 Alcohol abuse, uncomplicated: Secondary | ICD-10-CM

## 2017-08-13 DIAGNOSIS — F10151 Alcohol abuse with alcohol-induced psychotic disorder with hallucinations: Secondary | ICD-10-CM | POA: Insufficient documentation

## 2017-08-13 DIAGNOSIS — Z86718 Personal history of other venous thrombosis and embolism: Secondary | ICD-10-CM | POA: Insufficient documentation

## 2017-08-13 DIAGNOSIS — F10159 Alcohol abuse with alcohol-induced psychotic disorder, unspecified: Secondary | ICD-10-CM | POA: Diagnosis present

## 2017-08-13 DIAGNOSIS — F29 Unspecified psychosis not due to a substance or known physiological condition: Secondary | ICD-10-CM | POA: Insufficient documentation

## 2017-08-13 HISTORY — DX: Alcohol abuse, uncomplicated: F10.10

## 2017-08-13 HISTORY — DX: Anxiety disorder, unspecified: F41.9

## 2017-08-13 LAB — COMPREHENSIVE METABOLIC PANEL
ALT: 76 U/L — AB (ref 0–44)
AST: 54 U/L — AB (ref 15–41)
Albumin: 4.6 g/dL (ref 3.5–5.0)
Alkaline Phosphatase: 73 U/L (ref 38–126)
Anion gap: 12 (ref 5–15)
BUN: 12 mg/dL (ref 6–20)
CO2: 21 mmol/L — AB (ref 22–32)
Calcium: 9.4 mg/dL (ref 8.9–10.3)
Chloride: 103 mmol/L (ref 98–111)
Creatinine, Ser: 0.78 mg/dL (ref 0.61–1.24)
GFR calc Af Amer: >60 mL/min (ref >60)
Glucose, Bld: 115 mg/dL — ABNORMAL HIGH (ref 70–99)
POTASSIUM: 3.7 mmol/L (ref 3.5–5.1)
SODIUM: 136 mmol/L (ref 135–145)
Total Bilirubin: 1.2 mg/dL (ref 0.3–1.2)
Total Protein: 8.5 g/dL — ABNORMAL HIGH (ref 6.5–8.1)

## 2017-08-13 LAB — CBC WITH DIFFERENTIAL/PLATELET
Basophils Absolute: 0 10*3/uL (ref 0.0–0.1)
Basophils Relative: 0 %
EOS ABS: 0 10*3/uL (ref 0.0–0.7)
EOS PCT: 0 %
HCT: 42.5 % (ref 39.0–52.0)
HEMOGLOBIN: 14.7 g/dL (ref 13.0–17.0)
Lymphocytes Relative: 12 %
Lymphs Abs: 1 10*3/uL (ref 0.7–4.0)
MCH: 31.5 pg (ref 26.0–34.0)
MCHC: 34.6 g/dL (ref 30.0–36.0)
MCV: 91.2 fL (ref 78.0–100.0)
MONOS PCT: 8 %
Monocytes Absolute: 0.7 10*3/uL (ref 0.1–1.0)
NEUTROS PCT: 80 %
Neutro Abs: 6.7 10*3/uL (ref 1.7–7.7)
PLATELETS: 275 10*3/uL (ref 150–400)
RBC: 4.66 MIL/uL (ref 4.22–5.81)
RDW: 12.8 % (ref 11.5–15.5)
WBC: 8.4 10*3/uL (ref 4.0–10.5)

## 2017-08-13 LAB — ETHANOL

## 2017-08-13 MED ORDER — LORAZEPAM 1 MG PO TABS: 0.0000 mg | ORAL_TABLET | Freq: Two times a day (BID) | ORAL | Status: DC

## 2017-08-13 MED ORDER — ACETAMINOPHEN 325 MG PO TABS: 650.0000 mg | ORAL_TABLET | ORAL | Status: DC | PRN

## 2017-08-13 MED ORDER — LORAZEPAM 2 MG/ML IJ SOLN: 0.0000 mg | Freq: Four times a day (QID) | INTRAMUSCULAR | Status: DC

## 2017-08-13 MED ORDER — HALOPERIDOL LACTATE 2 MG/ML PO CONC
1.0000 mg | Freq: Two times a day (BID) | ORAL | Status: DC
  Administered 2017-08-13 – 2017-08-14 (×3): 1 mg via ORAL
  Filled 2017-08-13 (×4): qty 0.5

## 2017-08-13 MED ORDER — ONDANSETRON HCL 4 MG PO TABS: 4.0000 mg | ORAL_TABLET | Freq: Three times a day (TID) | ORAL | Status: DC | PRN

## 2017-08-13 MED ORDER — THIAMINE HCL 100 MG/ML IJ SOLN: 100.0000 mg | Freq: Every day | INTRAMUSCULAR | Status: DC

## 2017-08-13 MED ORDER — LORAZEPAM 1 MG PO TABS
0.0000 mg | ORAL_TABLET | Freq: Four times a day (QID) | ORAL | Status: DC
  Administered 2017-08-13 (×2): 1 mg via ORAL
  Filled 2017-08-13 (×2): qty 1

## 2017-08-13 MED ORDER — ALUM & MAG HYDROXIDE-SIMETH 200-200-20 MG/5ML PO SUSP: 30.0000 mL | Freq: Four times a day (QID) | ORAL | Status: DC | PRN

## 2017-08-13 MED ORDER — LORAZEPAM 2 MG/ML IJ SOLN: 0.0000 mg | Freq: Two times a day (BID) | INTRAMUSCULAR | Status: DC

## 2017-08-13 MED ORDER — VITAMIN B-1 100 MG PO TABS
100.0000 mg | ORAL_TABLET | Freq: Every day | ORAL | Status: DC
  Administered 2017-08-13 – 2017-08-14 (×2): 100 mg via ORAL
  Filled 2017-08-13 (×2): qty 1

## 2017-08-13 MED ORDER — ZOLPIDEM TARTRATE 5 MG PO TABS: 5.0000 mg | ORAL_TABLET | Freq: Every evening | ORAL | Status: DC | PRN

## 2017-08-13 NOTE — ED Notes (Signed)
Bed: [id number] Expected date:  Expected time:  Means of arrival:  Comments: 25

## 2017-08-13 NOTE — ED Triage Notes (Signed)
Patient reports that he quit drinking alcohol 6 days ago and is having anxiety. Patient states his family is wanting him to have blood work done.

## 2017-08-13 NOTE — BHH Counselor (Signed)
Writer updated pt on disposition recommendation. Pt's friend and mom are bedside. Pt thanked Clinical research associatewriter for the help. 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 Pt to have peer support consult tomorrow.   Evette Cristalaroline Paige Capri Veals, [place] Therapeutic Triage Specialist

## 2017-08-13 NOTE — ED Provider Notes (Signed)
[facility]-EMERGENCY DEPT Provider Note   CSN: [id number] Arrival date & time: 08/13/17  0906     History   Chief Complaint Chief Complaint  Patient presents with  . wants vital signs  . wants blood work done  . Anxiety    HPI NASSER KU is a 32 y.o. male.  HPI   32 year old male with history of alcohol abuse, anxiety, presenting requesting for "blood work done" history obtained through patient, family members at bedside and on the phone.  Patient admits to history of heavy alcohol abuse however he decided to quit cold [place] approximately 10 days ago.  He is a traveling Technical sales engineer.  He report having difficulty with alcohol cessation and for the past few days family member noticed that patient has been more paranoid, having symptoms of psychosis, and out of his character.  Patient also admits to feeling "out of it" he denies SI/HI but endorses possible auditory and visual hallucination.  He is requesting for help.  He endorsed that his anxiety is "through the roof" he denies nausea vomiting diarrhea or active pain.  He endorsed occasional headache.  Report history of shingles in the face several months ago, states he was seen by an eye specialist last week and was given some medication.  Mentioned that his rash has since resolved.  Past Medical History:  Diagnosis Date  . Alcohol abuse   . Anxiety   . DVT of lower extremity (deep venous thrombosis) (HCC) 10/22/12   Rt. lower leg, mid to distal SFV, extending down through the popliteal and posterior tibial and peroneal veins    Patient Active Problem List   Diagnosis Date Noted  . Anticoagulated on Xarelto for DVT-RT, stopped 04/08/13 11/10/2012  . GI bleed, bright blood with stool, improved with stool softner 11/10/2012  . DVT of lower limb, acute, involving mid to distal SFV extending down through the popliteal vein and posterior tibial and peroneal veins 10/22/2012  . Hx of fracture of femur, rt from fall  in [place]. with surgery 10/22/2012    Past Surgical History:  Procedure Laterality Date  . FEMUR FRACTURE SURGERY  10/06/12   rods placed right leg- Dr. [name]  . foot warts    . MOUTH SURGERY          Home Medications    Prior to Admission medications   Medication Sig Start Date End Date Taking? Authorizing Provider  ALPRAZolam Prudy Feeler) 1 MG tablet Take 0.5-1 mg by mouth at bedtime as needed for sleep or anxiety.    [provider]  ibuprofen (ADVIL,MOTRIN) 800 MG tablet Take 800 mg by mouth as needed. 11/23/12   Kirichenko, Tatyana, PA-C  mometasone (NASONEX) 50 MCG/ACT nasal spray Place 2 sprays into the nose daily as needed (allergies).    [provider]  XARELTO 20 MG TABS tablet TAKE 1 TABLET EVERY DAY WITH SUPPER. BEGIN WHEN 15 MG TWICE DAILY TREATMENT IS COMPLETE 04/24/13   Runell Gess, MD    Family History Family History  Problem Relation Age of Onset  . Diabetes type I Sister   . Heart failure Maternal Grandmother   . Hypertension Maternal Grandmother   . Healthy Mother   . Healthy Father   . Healthy Brother     Social History Social History   Tobacco Use  . Smoking status: Former Games developer  . Smokeless tobacco: Never Used  . Tobacco comment: quit 8-9 years ago  Substance Use Topics  . Alcohol use: Not  Currently    Alcohol/week: 3.6 - 4.2 oz    Types: 6 - 7 Standard drinks or equivalent per week    Comment: patient states he quit drinking 6 days ago  . Drug use: No     Allergies   Patient has no known allergies.   Review of Systems Review of Systems  All other systems reviewed and are negative.    Physical Exam Updated Vital Signs BP (!) 184/119 (BP Location: Left Arm)   Pulse 94   Temp 97.8 F (36.6 C) (Oral)   Resp 12   Ht 6' 2.5" (1.892 m)   Wt 91.6 kg (202 lb)   SpO2 98%   BMI 25.59 kg/m   Physical Exam  Constitutional: He is oriented to person, place, and time. He appears well-developed and well-nourished. No  distress.  HENT:  Head: Atraumatic.  Mouth/Throat: Oropharynx is clear and moist.  Eyes: Pupils are equal, round, and reactive to light. Conjunctivae and EOM are normal.  Neck: Normal range of motion. Neck supple.  No nuchal rigidity  Cardiovascular: Normal rate and regular rhythm.  Pulmonary/Chest: Effort normal and breath sounds normal.  Abdominal: Soft. Bowel sounds are normal. He exhibits no distension. There is no tenderness.  Neurological: He is alert and oriented to person, place, and time. He has normal strength. No cranial nerve deficit or sensory deficit. GCS eye subscore is 4. GCS verbal subscore is 5. GCS motor subscore is 6.  Skin: No rash noted.  Psychiatric: He has a normal mood and affect. His speech is normal and behavior is normal. Thought content is paranoid and delusional. He expresses no homicidal and no suicidal ideation.  Nursing note and vitals reviewed.    ED Treatments / Results  Labs (all labs ordered are listed, but only abnormal results are displayed) Labs Reviewed  COMPREHENSIVE METABOLIC PANEL - Abnormal; Notable for the following components:      Result Value   CO2 21 (*)    Glucose, Bld 115 (*)    Total Protein 8.5 (*)    AST 54 (*)    ALT 76 (*)    All other components within normal limits  CBC WITH DIFFERENTIAL/PLATELET  ETHANOL    EKG None  Radiology No results found.  Procedures Procedures (including critical care time)  Medications Ordered in ED Medications - No data to display   Initial Impression / Assessment and Plan / ED Course  I have reviewed the triage vital signs and the nursing notes.  Pertinent labs & imaging results that were available during my care of the patient were reviewed by me and considered in my medical decision making (see chart for details).     BP (!) 156/100 (BP Location: Right Arm)   Pulse 89   Temp 98.3 F (36.8 C) (Oral)   Resp 20   Ht 6' 2.5" (1.892 m)   Wt 91.6 kg (202 lb)   SpO2 98%   BMI  25.59 kg/m    Final Clinical Impressions(s) / ED Diagnoses   Final diagnoses:  Psychosis, unspecified psychosis type (HCC)  Alcohol abuse    ED Discharge Orders    None     10:54 AM Patient actively try and quit alcohol and reportedly have been quitting cold [place] for 10 days.  He endorsed having psychosis, with trace of paranoia but no SI/HI.  Family members are concerned.  He does not exhibit any seizure activities and doubt DVT since last alcohol consumption is 10 days ago.  Will consult TTS for further evaluation of his psychosis.  No evidence of shingles related rash on facial exam.  He has no focal neuro deficit.  He has no nuchal rigidity and he is afebrile.  1:39 PM Patient is medically clear.  His symptoms is not consistent with delirium but more likely psychosis.  Consultation from TTS who have evaluate patient.  Plan to have patient observe overnight for psychiatric evaluation tomorrow and determine disposition.   Fayrene Helper, PA-C 08/13/17 1342    Samuel Jester, DO 08/17/17 1334
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
## 2017-08-14 DIAGNOSIS — F10159 Alcohol abuse with alcohol-induced psychotic disorder, unspecified: Secondary | ICD-10-CM | POA: Diagnosis present

## 2017-08-14 DIAGNOSIS — F10151 Alcohol abuse with alcohol-induced psychotic disorder with hallucinations: Secondary | ICD-10-CM

## 2017-08-14 MED ORDER — GABAPENTIN 300 MG PO CAPS
300.0000 mg | ORAL_CAPSULE | Freq: Three times a day (TID) | ORAL | Status: DC
  Administered 2017-08-14: 300 mg via ORAL
  Filled 2017-08-14: qty 1

## 2017-08-14 MED ORDER — HALOPERIDOL LACTATE 2 MG/ML PO CONC: 1.0000 mg | Freq: Two times a day (BID) | ORAL | 0 refills | Status: AC

## 2017-08-14 MED ORDER — GABAPENTIN 300 MG PO CAPS: 300.0000 mg | ORAL_CAPSULE | Freq: Three times a day (TID) | ORAL | 0 refills | Status: AC

## 2017-08-14 MED ORDER — GABAPENTIN 300 MG PO CAPS: 300.0000 mg | ORAL_CAPSULE | Freq: Three times a day (TID) | ORAL | 0 refills | Status: DC

## 2017-08-14 NOTE — Consult Note (Addendum)
[place] Psychiat[facility] Consult   Reason for Consult:  Alcohol withdrawal with psychosis Referring Physician:  EDP Patient Identification: Brent Berry MRN:  [id number] Principal Diagnosis: Alcohol abuse with alcohol-induced psychotic disorder [facility]) Diagnosis:   Patient Active Problem List   Diagnosis Date Noted  . Alcohol abuse with alcohol-induced psychotic disorder ([place]) [F10.159] 08/14/2017    Priority: High  . Anticoagulated on Xarelto for DVT-RT, stopped 04/08/13 [Z79.01] 11/10/2012  . GI bleed, bright blood with stool, improved with stool softner [K92.2] 11/10/2012  . DVT of lower limb, acute, involving mid to distal SFV extending down through the popliteal vein and posterior tibial and peroneal veins [I82.409] 10/22/2012  . Hx of fracture of femur, rt from fall in [place]. with surgery [Z87.81] 10/22/2012    Total Time spent with patient: 45 minutes  Subjective:   Brent Berry is a 32 y.o. male patient has stabilized.  HPI:  32 yo male who presented to the ED with hallucinations.  He never had hallucinations until 6/25 when he started seeing thinking and feeling tinglings in his eyes.  He was also hearing noises.  Denies seizures.  His friend and brother reported he was paranoid and speaking nonsensically.  He was self medicating his anxiety with alcohol but it did not alleviate symptoms.  Today, he denies suicidal/homicidal ideations, hallucinations, and withdrawal symptoms.  He does report not sleeping until last night.  His lack of sleep may have contributed to his psychosis.  He is going to live with his mother after discharge to maintain his sobriety.  Peer support consult placed and Gabapentin prescription provided.  Stable for discharge.  Past Psychiatric History: alcohol abuse   Risk to Self: Suicidal Ideation: No Suicidal Intent: No Is patient at risk for suicide?: No Suicidal Plan?: No Access to Means: No What has been your use of drugs/alcohol within the last  12 months?: daily etoh use, daily THC use until one month ago How many times?: 0 Other Self Harm Risks: none Triggers for Past Attempts: (n/a) Intentional Self Injurious Behavior: None Risk to Others: Homicidal Ideation: No Thoughts of Harm to Others: No Current Homicidal Intent: No Current Homicidal Plan: No Access to Homicidal Means: No Identified Victim: none History of harm to others?: No Assessment of Violence: None Noted Violent Behavior Description: pt denies history of violence Does patient have access to weapons?: No Criminal Charges Pending?: No Does patient have a court date: No Prior Inpatient Therapy: Prior Inpatient Therapy: No Prior Outpatient Therapy: Prior Outpatient Therapy: No Does patient have an ACCT team?: No Does patient have Intensive In-House Services?  : No Does patient have [facility] services? : No Does patient have P4CC services?: No  Past Medical History:  Past Medical History:  Diagnosis Date  . Alcohol abuse   . Anxiety   . DVT of lower extremity (deep venous thrombosis) ([place]) 10/22/12   Rt. lower leg, mid to distal SFV, extending down through the popliteal and posterior tibial and peroneal veins    Past Surgical History:  Procedure Laterality Date  . FEMUR FRACTURE SURGERY  10/06/12   rods placed right leg- Dr. Percell Miller  . foot warts    . MOUTH SURGERY     Family History:  Family History  Problem Relation Age of Onset  . Diabetes type I Sister   . Heart failure Maternal Grandmother   . Hypertension Maternal Grandmother   . Healthy Mother   . Healthy Father   . Healthy Brother    Family  Psychiatric  History: none Social History:  Social History   Substance and Sexual Activity  Alcohol Use Not Currently  . Alcohol/week: 3.6 - 4.2 oz  . Types: 6 - 7 Standard drinks or equivalent per week   Comment: patient states he quit drinking 5 days ago     Social History   Substance and Sexual Activity  Drug Use Yes  . Types: Marijuana     Social History   Socioeconomic History  . Marital status: Single    Spouse name: Not on file  . Number of children: Not on file  . Years of education: Not on file  . Highest education level: Not on file  Occupational History  . Not on file  Social Needs  . Financial resource strain: Not on file  . Food insecurity:    Worry: Not on file    Inability: Not on file  . Transportation needs:    Medical: Not on file    Non-medical: Not on file  Tobacco Use  . Smoking status: Former Research scientist (life sciences)  . Smokeless tobacco: Never Used  . Tobacco comment: quit 8-9 years ago  Substance and Sexual Activity  . Alcohol use: Not Currently    Alcohol/week: 3.6 - 4.2 oz    Types: 6 - 7 Standard drinks or equivalent per week    Comment: patient states he quit drinking 5 days ago  . Drug use: Yes    Types: Marijuana  . Sexual activity: Not on file  Lifestyle  . Physical activity:    Days per week: Not on file    Minutes per session: Not on file  . Stress: Not on file  Relationships  . Social connections:    Talks on phone: Not on file    Gets together: Not on file    Attends religious service: Not on file    Active member of club or organization: Not on file    Attends meetings of clubs or organizations: Not on file    Relationship status: Not on file  Other Topics Concern  . Not on file  Social History Narrative  . Not on file   Additional Social History: [name]    Allergies:  No Known Allergies  Labs:  Results for orders placed or performed during the hospital encounter of 08/13/17 (from the past 48 hour(s))  Comprehensive metabolic panel     Status: Abnormal   Collection Time: 08/13/17  9:25 AM  Result Value Ref Range   Sodium 136 135 - 145 mmol/L   Potassium 3.7 3.5 - 5.1 mmol/L   Chloride 103 98 - 111 mmol/L    Comment: Please note change in reference range.   CO2 21 (L) 22 - 32 mmol/L   Glucose, Bld 115 (H) 70 - 99 mg/dL    Comment: Please note change in reference range.   BUN  12 6 - 20 mg/dL    Comment: Please note change in reference range.   Creatinine, Ser 0.78 0.61 - 1.24 mg/dL   Calcium 9.4 8.9 - 10.3 mg/dL   Total Protein 8.5 (H) 6.5 - 8.1 g/dL   Albumin 4.6 3.5 - 5.0 g/dL   AST 54 (H) 15 - 41 U/L   ALT 76 (H) 0 - 44 U/L    Comment: Please note change in reference range.   Alkaline Phosphatase 73 38 - 126 U/L   Total Bilirubin 1.2 0.3 - 1.2 mg/dL   GFR calc non Af Amer >60 >60 mL/min  GFR calc Af Amer >60 >60 mL/min    Comment: (NOTE) The eGFR has been calculated using the CKD EPI equation. This calculation has not been validated in all clinical situations. eGFR's persistently <60 mL/min signify possible Chronic Kidney Disease.    Anion gap 12 5 - 15    Comment: Performed at [facility], [place] [address] [place], [place] [address]  CBC with Differential     Status: None   Collection Time: 08/13/17  9:25 AM  Result Value Ref Range   WBC 8.4 4.0 - 10.5 K/uL   RBC 4.66 4.22 - 5.81 MIL/uL   Hemoglobin 14.7 13.0 - 17.0 g/dL   HCT 42.5 39.0 - 52.0 %   MCV 91.2 78.0 - 100.0 fL   MCH 31.5 26.0 - 34.0 pg   MCHC 34.6 30.0 - 36.0 g/dL   RDW 12.8 11.5 - 15.5 %   Platelets 275 150 - 400 K/uL   Neutrophils Relative % 80 %   Neutro Abs 6.7 1.7 - 7.7 K/uL   Lymphocytes Relative 12 %   Lymphs Abs 1.0 0.7 - 4.0 K/uL   Monocytes Relative 8 %   Monocytes Absolute 0.7 0.1 - 1.0 K/uL   Eosinophils Relative 0 %   Eosinophils Absolute 0.0 0.0 - 0.7 K/uL   Basophils Relative 0 %   Basophils Absolute 0.0 0.0 - 0.1 K/uL    Comment: Performed at [facility], [place] [address] [place], [place] [address]  Ethanol     Status: None   Collection Time: 08/13/17  9:41 AM  Result Value Ref Range   Alcohol, Ethyl (B) <10 <10 mg/dL    Comment: (NOTE) Lowest detectable limit for serum alcohol is 10 mg/dL. For medical purposes only. Performed at [facility], [place] [address], [place], [place] [address]      Current Facility-Administered Medications  Medication Dose Route Frequency Provider Last Rate Last Dose  . acetaminophen (TYLENOL) tablet 650 mg  650 mg Oral Q4H PRN Domenic Moras, PA-C      . alum & mag hydroxide-simeth (MAALOX/MYLANTA) 200-200-20 MG/5ML suspension 30 mL  30 mL Oral Q6H PRN Domenic Moras, PA-C      . haloperidol (HALDOL) 2 MG/ML solution 1 mg  1 mg Oral BID Patrecia Pour, NP   1 mg at 08/14/17 0000  . LORazepam (ATIVAN) injection 0-4 mg  0-4 mg Intravenous Q6H Domenic Moras, PA-C       Or  . LORazepam (ATIVAN) tablet 0-4 mg  0-4 mg Oral Q6H Domenic Moras, PA-C   1 mg at 08/13/17 2011  . [START ON 08/15/2017] LORazepam (ATIVAN) injection 0-4 mg  0-4 mg Intravenous Q12H Domenic Moras, PA-C       Or  . Derrill Memo ON 08/15/2017] LORazepam (ATIVAN) tablet 0-4 mg  0-4 mg Oral Q12H Domenic Moras, PA-C      . ondansetron [facility]) tablet 4 mg  4 mg Oral Q8H PRN Domenic Moras, PA-C      . thiamine (VITAMIN B-1) tablet 100 mg  100 mg Oral Daily Domenic Moras, PA-C   100 mg at 08/13/17 1500   Or  . thiamine (B-1) injection 100 mg  100 mg Intravenous Daily Domenic Moras, PA-C       Current Outpatient Medications  Medication Sig Dispense Refill  . bacitracin ophthalmic ointment Place 500 Units into right nostril 2 (two) times daily.  1  . Ibuprofen 200 MG CAPS Take 200 mg by mouth every 6 (six)  hours as needed for mild pain.     . sodium chloride (OCEAN) 0.65 % SOLN nasal spray Place 1 spray into both nostrils as needed (allergies).    . valACYclovir (VALTREX) 1000 MG tablet Take 1,000 mg by mouth daily.  3  . venlafaxine XR (EFFEXOR-XR) 37.5 MG 24 hr capsule Take 37.5 mg by mouth daily.  2  . ALPRAZolam (XANAX) 1 MG tablet Take 0.5-1 mg by mouth at bedtime as needed for sleep or anxiety.    Alveda Reasons 20 MG TABS tablet TAKE 1 TABLET EVERY DAY WITH SUPPER. BEGIN WHEN 15 MG TWICE DAILY TREATMENT IS COMPLETE (Patient not taking: Reported on 08/13/2017) 30 tablet 5    Musculoskeletal: Strength & Muscle  Tone: within normal limits Gait & Station: normal Patient leans: [name]  Psychiatric Specialty Exam: Physical Exam  Nursing note and vitals reviewed. Constitutional: He is oriented to person, place, and time. He appears well-developed and well-nourished.  HENT:  Head: Normocephalic and atraumatic.  Neck: Normal range of motion.  Respiratory: Effort normal.  Musculoskeletal: Normal range of motion.  Neurological: He is alert and oriented to person, place, and time.  Psychiatric: His speech is normal and behavior is normal. Judgment and thought content normal. His mood appears anxious. Cognition and memory are normal.    Review of Systems  Constitutional: Positive for malaise/fatigue.  Psychiatric/Behavioral: Positive for substance abuse. The patient is nervous/anxious.   All other systems reviewed and are negative.   Blood pressure (!) 130/94, pulse 96, temperature 97.8 F (36.6 C), temperature source Oral, resp. rate 18, height 6' 2.5" (1.892 m), weight 91.6 kg (202 lb), SpO2 99 %.Body mass index is 25.59 kg/m.  General Appearance: Casual  Eye Contact:  Good  Speech:  Normal Rate  Volume:  Normal  Mood:  Anxious  Affect:  Congruent  Thought Process:  Coherent and Descriptions of Associations: Intact  Orientation:  Full (Time, Place, and Person)  Thought Content:  WDL and Logical  Suicidal Thoughts:  No  Homicidal Thoughts:  No  Memory:  Immediate;   Good Recent;   Fair Remote;   Good  Judgement:  Fair  Insight:  Fair  Psychomotor Activity:  Normal  Concentration:  Concentration: Good and Attention Span: Good  Recall:  Good  Fund of Knowledge:  Good  Language:  Good  Akathisia:  No  Handed:  Right  AIMS (if indicated):   [name]  Assets:  Housing Leisure Time Physical Health Resilience Social Support  ADL's:  Intact  Cognition:  WNL  Sleep:   [name]     Treatment Plan Summary: Alcohol abuse with alcohol withdrawal psychosis with hallucinations -Ativan alcohol detox  protocol started -Started Gabapentin 300 mg TID for withdrawal symptoms -Started Haldol 1 mg BID for psychosis  Disposition: No evidence of imminent risk to self or others at present.    Waylan Boga, NP 08/14/2017 9:39 AM   Patient seen face-to-face for psychiatric evaluation, chart reviewed and case discussed with the physician extender and developed treatment plan. Reviewed the information documented and agree with the treatment plan.  Buford Dresser, DO 08/14/17 5:19 PM

## 2017-08-14 NOTE — BHH Suicide Risk Assessment (Signed)
Suicide Risk Assessment  Discharge Assessment   [facility]   Principal Problem: Alcohol abuse with alcohol-induced psychotic disorder [facility](HCC) Discharge Diagnoses:  Patient Active Problem List   Diagnosis Date Noted  . Alcohol abuse with alcohol-induced psychotic disorder (HCC) [F10.159] 08/14/2017    Priority: High  . Anticoagulated on Xarelto for DVT-RT, stopped 04/08/13 [Z79.01] 11/10/2012  . GI bleed, bright blood with stool, improved with stool softner [K92.2] 11/10/2012  . DVT of lower limb, acute, involving mid to distal SFV extending down through the popliteal vein and posterior tibial and peroneal veins [I82.409] 10/22/2012  . H[place] of femur[place] [Z87.81] 10/22/2012    Total Time spent with patient: 45 minutes    Musculoskeletal: Strength & Muscle Tone: within normal limits Gait & Station: normal Patient leans: [name]  Psychiatric Specialty Exam: Physical Exam  Nursing note and vitals reviewed. Constitutional: He is oriented to person, place, and time. He appears well-developed and well-nourished.  HENT:  Head: Normocephalic.  Neck: Normal range of motion.  Respiratory: Effort normal.  Musculoskeletal: Normal range of motion.  Neurological: He is alert and oriented to person, place, and time.  Psychiatric: His speech is normal and behavior is normal. Judgment and thought content normal. His mood appears anxious. Cognition and memory are normal.    Review of Systems  Constitutional: Positive for malaise/fatigue.  Psychiatric/Behavioral: Positive for substance abuse. The patient is nervous/anxious.   All other systems reviewed and are negative.   Blood pressure (!) 130/94, pulse 96, temperature 97.8 F (36.6 C), temperature source Oral, resp. rate 18, height 6' 2.5" (1.892 m), weight 91.6 kg (202 lb), SpO2 99 %.Body mass index is 25.59 kg/m.  General Appearance: Casual  Eye Contact:  Good  Speech:   Normal Rate  Volume:  Normal  Mood:  Anxious  Affect:  Congruent  Thought Process:  Coherent and Descriptions of Associations: Intact  Orientation:  Full (Time, Place, and Person)  Thought Content:  WDL and Logical  Suicidal Thoughts:  No  Homicidal Thoughts:  No  Memory:  Immediate;   Good Recent;   Fair Remote;   Good  Judgement:  Fair  Insight:  Fair  Psychomotor Activity:  Normal  Concentration:  Concentration: Good and Attention Span: Good  Recall:  Good  Fund of Knowledge:  Good  Language:  Good  Akathisia:  No  Handed:  Right  AIMS (if indicated):     Assets:  Housing Leisure Time Physical Health Resilience Social Support  ADL's:  Intact  Cognition:  WNL  Sleep:      Mental Status Per Nursing Assessment::   On Admission:   alcohol withdrawal with psychosis  Demographic Factors:  Male and Caucasian  Loss Factors: NA  Historical Factors: NA  Risk Reduction Factors:   Sense of responsibility to family, Living with another person, especially a relative and Positive social support  Continued Clinical Symptoms:  Anxiety, mild  Cognitive Features That Contribute To Risk:  None    Suicide Risk:  Minimal: No identifiable suicidal ideation.  Patients presenting with no risk factors but with morbid ruminations; may be classified as minimal risk based on the severity of the depressive symptoms    Plan Of Care/Follow-up recommendations:  Activity:  as tolerated Diet:  heart healthy diet  Tyce Delcid, NP 08/14/2017, 10:35 AM

## 2017-08-14 NOTE — ED Notes (Signed)
RX X 2 GIVEN 

## 2017-08-14 NOTE — Discharge Instructions (Signed)
To help you maintain a sober lifestyle, a substance abuse treatment program may be beneficial to you.  Contact [facility] at your earliest opportunity to ask about enrolling in their program: ° °     [facility] °     [address] °     , [place] [address] °     [phone] °     New patients are seen at the walk-in clinic every Tuesday from 9:00 am - 12:00 pm °

## 2017-08-14 NOTE — BHH Counselor (Signed)
Disposition:  Dr. Sharma CovertNorman and Nanine MeansJamison Lord, DNP, completed psychiatric rounds on patient this a.m. Per report patient does not meet criteria for inpatient and has been recommended for discharge.

## 2017-08-14 NOTE — ED Notes (Signed)
PEER SUPPORT BRIAN SPOKE WITH PT
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
## 2017-08-14 NOTE — Progress Notes (Signed)
CSW consulted for SA resources. CSW spoke with Brent Berry in [facility] and was informed that hospital has Peer support as well as staff has needed resources to give to pt when needed. At this time there are no further CSW needs. CSW will sign off.    Claude MangesKierra S. Falicity Sheets, MSW, LCSW-A Emergency Department Clinical Social Worker [phone]

## 2017-08-14 NOTE — ED Notes (Signed)
Called family to verify place of pt belongings. I spoke with the pt mother Brent Soho([place]) stated that she did take her sons belongings that included a cell phone and outfit.

## 2017-08-14 NOTE — BH Assessment (Signed)
BHH Assessment Progress Note  Per Jacqueline Norman, DO, this pt does not require psychiatric hospitalization at this time.  Pt is to be discharged from [facility] with recommendation to [facility] with [facility].  This has been included in pt's discharge instructions.  Pt would also benefit from seeing Peer Support Specialists; they will be asked to speak to pt.  Pt's nurse, Ashley, has been notified.  Brent Oatis, MA Triage Specialist [phone]     

## 2017-08-14 NOTE — ED Notes (Signed)
TTS AT BEDSIDE 

## 2023-07-31 DIAGNOSIS — Z113 Encounter for screening for infections with a predominantly sexual mode of transmission: Secondary | ICD-10-CM | POA: Diagnosis not present
